# Patient Record
Sex: Female | Born: 1950 | Race: White | Hispanic: No | Smoking: Never smoker
Health system: Southern US, Community
[De-identification: ages and names within clinical notes are randomized; demographics above are authoritative.]

## PROBLEM LIST (undated history)

## (undated) DIAGNOSIS — I10 Essential (primary) hypertension: Secondary | ICD-10-CM

## (undated) HISTORY — PX: VASCULAR SURGERY: SHX849

---

## 2017-04-07 ENCOUNTER — Ambulatory Visit (HOSPITAL_COMMUNITY)
Admission: EM | Admit: 2017-04-07 | Discharge: 2017-04-07 | Disposition: A | Discharge status: Home / Self Care | Payer: Medicare Other | Attending: Family Medicine | Admitting: Family Medicine

## 2017-04-07 ENCOUNTER — Encounter (HOSPITAL_COMMUNITY): Payer: Self-pay

## 2017-04-07 ENCOUNTER — Other Ambulatory Visit: Payer: Self-pay

## 2017-04-07 ENCOUNTER — Encounter (HOSPITAL_COMMUNITY): Payer: Self-pay | Admitting: Emergency Medicine

## 2017-04-07 DIAGNOSIS — J019 Acute sinusitis, unspecified: Secondary | ICD-10-CM | POA: Diagnosis not present

## 2017-04-07 HISTORY — DX: Essential (primary) hypertension: I10

## 2017-04-07 MED ORDER — AMOXICILLIN-POT CLAVULANATE 875-125 MG PO TABS: 1.0000 | ORAL_TABLET | Freq: Two times a day (BID) | ORAL | 0 refills | Status: AC

## 2017-04-07 NOTE — ED Triage Notes (Signed)
PT reports URI symptoms 2 weeks ago that have resolved. PT now has long term sinus pain and pressure.

## 2017-04-07 NOTE — ED Provider Notes (Signed)
[facility]    CSN: [id number] Arrival date & time: 04/07/17  1756     History   Chief Complaint Chief Complaint  Patient presents with  . Nasal Congestion    HPI Molly Turner is a 67 y.o. female story of hypertension, Patient is presenting with URI symptoms- congestion, cough, sore throat.  Cough and sore throat are occasional, not persistent.  Denies ear pain. patient's main complaints are congestion. Symptoms have been going on for 2 weeks. Patient has tried Mucinex, TheraFlu, Mucus Relief PE, with minimal relief. Denies fever, nausea, vomiting, diarrhea.  Had one episode of vomiting resolved.  Denies shortness of breath and chest pain.  Marland Kitchen.   HPI  Past Medical History:  Diagnosis Date  . Hypertension     There are no active problems to display for this patient.   Past Surgical History:  Procedure Laterality Date  . VASCULAR SURGERY      OB History    No data available       Home Medications    Prior to Admission medications   Medication Sig Start Date End Date Taking? Authorizing Provider  esomeprazole (NEXIUM) 10 MG packet Take 10 mg by mouth daily before breakfast.   Yes [provider]  potassium chloride SA (K-DUR,KLOR-CON) 20 MEQ tablet Take 20 mEq by mouth once.   Yes [provider]  traZODone (DESYREL) 50 MG tablet Take 50 mg by mouth at bedtime.   Yes [provider]  triamterene (DYRENIUM) 50 MG capsule Take 25 mg by mouth daily.   Yes [provider]  amoxicillin-clavulanate (AUGMENTIN) 875-125 MG tablet Take 1 tablet by mouth 2 (two) times daily for 10 days. 04/07/17 04/17/17  Demarie Hyneman, Junius CreamerHallie C, PA-C    Family History History reviewed. No pertinent family history.  Social History Social History   Tobacco Use  . Smoking status: Never Smoker  . Smokeless tobacco: Never Used  Substance Use Topics  . Alcohol use: Yes  . Drug use: No     Allergies   Codeine   Review of Systems Review of  Systems  Constitutional: Negative for chills, fatigue and fever.  HENT: Positive for congestion, rhinorrhea, sinus pressure and sore throat. Negative for ear pain and trouble swallowing.   Respiratory: Positive for cough. Negative for chest tightness and shortness of breath.   Cardiovascular: Negative for chest pain.  Gastrointestinal: Negative for abdominal pain, nausea and vomiting.  Musculoskeletal: Negative for myalgias.  Skin: Negative for rash.  Neurological: Negative for dizziness, light-headedness and headaches.     Physical Exam Triage Vital Signs ED Triage Vitals [04/07/17 1929]  Enc Vitals Group     BP 140/85     Pulse Rate 79     Resp 16     Temp 98 F (36.7 C)     Temp Source Oral     SpO2 98 %     Weight      Height      Head Circumference      Peak Flow      Pain Score      Pain Loc      Pain Edu?      Excl. in GC?    No data found.  Updated Vital Signs BP 140/85 (BP Location: Left Arm)   Pulse 79   Temp 98 F (36.7 C) (Oral)   Resp 16   SpO2 98%    Physical Exam  Constitutional: She appears well-developed and well-nourished. No distress.  HENT:  Head: Normocephalic and atraumatic.  Right Ear: Tympanic membrane and ear canal normal.  Left Ear: Tympanic membrane and ear canal normal.  Nose: Rhinorrhea present. Right sinus exhibits no maxillary sinus tenderness and no frontal sinus tenderness. Left sinus exhibits no maxillary sinus tenderness and no frontal sinus tenderness.  Mouth/Throat: Uvula is midline and mucous membranes are normal. No oral lesions. No trismus in the jaw. No uvula swelling. Posterior oropharyngeal erythema present. Tonsils are 1+ on the right. Tonsils are 1+ on the left. No tonsillar exudate.  Non-erythematous TMs  Erythematous turbinates, non swollen   Eyes: Conjunctivae are normal.  Neck: Neck supple.  Cardiovascular: Normal rate and regular rhythm.  No murmur heard. Pulmonary/Chest: Effort normal and breath sounds  normal. No respiratory distress.  Abdominal: Soft. There is no tenderness.  Musculoskeletal: She exhibits no edema.  Neurological: She is alert.  Skin: Skin is warm and dry.  Psychiatric: She has a normal mood and affect.  Nursing note and vitals reviewed.    UC Treatments / Results  Labs (all labs ordered are listed, but only abnormal results are displayed) Labs Reviewed - No data to display  EKG  EKG Interpretation None       Radiology No results found.  Procedures Procedures (including critical care time)  Medications Ordered in UC Medications - No data to display   Initial Impression / Assessment and Plan / UC Course  I have reviewed the triage vital signs and the nursing notes.  Pertinent labs & imaging results that were available during my care of the patient were reviewed by me and considered in my medical decision making (see chart for details).      Patient presents with symptoms likely from sinusitis versus viral upper respiratory infection.  Do not suspect underlying cardiopulmonary process. Symptoms seem unlikely related to ACS, CHF or COPD exacerbations, pneumonia, pneumothorax. Patient is nontoxic appearing and not in need of emergent medical intervention.  Given symptoms have been going on for 2 weeks, we will treat for sinusitis with Augmentin.  Patient has been using Mucinex with minimal relief advised to try Flonase instead.  Also start daily allergy pill. Recommended symptom control with over the counter medications: Daily oral anti-histamine, Oral decongestant or IN corticosteroid, saline irrigations, cepacol lozenges, Robitussin, Delsym, honey tea.  Return if symptoms fail to improve in 1-2 weeks or you develop shortness of breath, chest pain, severe headache. Patient states understanding and is agreeable.      Final Clinical Impressions(s) / UC Diagnoses   Final diagnoses:  Acute sinusitis with symptoms > 10 days    ED Discharge Orders          Ordered    amoxicillin-clavulanate (AUGMENTIN) 875-125 MG tablet  2 times daily     04/07/17 1949       Controlled Substance Prescriptions [place] Controlled Substance Registry consulted? Not Applicable   Lew Dawes, [place] 04/07/17 2054

## 2017-04-07 NOTE — Discharge Instructions (Signed)
We are treating you for a sinus infection with Augmentin.  Please take twice daily for 10 days.  For better control congestion you may try Flonase instead of Mucinex.  You may get this over-the-counter, nasal spray to use in both nostrils once daily.  Please take a daily allergy pill like Zyrtec or Claritin, start brand is okay also.  This will take 5-7 days for you to notice an effect.  Continue to try to stay hydrated.  Please return if symptoms not improving with treatment.  Please return sooner if symptoms worsening or develop shortness of breath, difficulty breathing.

## 2017-04-07 NOTE — ED Notes (Signed)
Pt sts she has to leave.... sts [place] an apt [place]

## 2017-04-07 NOTE — ED Triage Notes (Signed)
[facility] and [facility], pt has taken OTC Medications but has no relief

## 2020-11-27 ENCOUNTER — Other Ambulatory Visit: Payer: Self-pay

## 2020-11-27 ENCOUNTER — Ambulatory Visit
Admission: RE | Admit: 2020-11-27 | Discharge: 2020-11-27 | Disposition: A | Discharge status: Home / Self Care | Payer: PRIVATE HEALTH INSURANCE | Source: Ambulatory Visit | Attending: Family Medicine | Admitting: Family Medicine

## 2020-11-27 ENCOUNTER — Other Ambulatory Visit: Payer: Self-pay | Admitting: Family Medicine

## 2020-11-27 DIAGNOSIS — R053 Chronic cough: Secondary | ICD-10-CM

## 2021-04-12 ENCOUNTER — Other Ambulatory Visit: Payer: Self-pay | Admitting: Family Medicine

## 2021-04-12 ENCOUNTER — Ambulatory Visit
Admission: RE | Admit: 2021-04-12 | Discharge: 2021-04-12 | Disposition: A | Discharge status: Home / Self Care | Payer: Medicare Other | Source: Ambulatory Visit | Attending: Family Medicine | Admitting: Family Medicine

## 2021-04-12 DIAGNOSIS — R053 Chronic cough: Secondary | ICD-10-CM

## 2021-10-30 ENCOUNTER — Other Ambulatory Visit: Payer: Self-pay | Admitting: Family Medicine

## 2021-10-30 DIAGNOSIS — E2839 Other primary ovarian failure: Secondary | ICD-10-CM

## 2022-03-13 LAB — COLOGUARD: COLOGUARD: POSITIVE — AB

## 2022-03-13 LAB — EXTERNAL GENERIC LAB PROCEDURE: COLOGUARD: POSITIVE — AB

## 2022-09-27 IMAGING — CR DG CHEST 2V
2 series · 2 of 2 positions shown · non-contrast
Comparison: 11/27/2020 chest radiograph

CLINICAL DATA: Cough and shortness of breath for 1 month.

EXAM:
CHEST - 2 VIEW

[w chest pa]
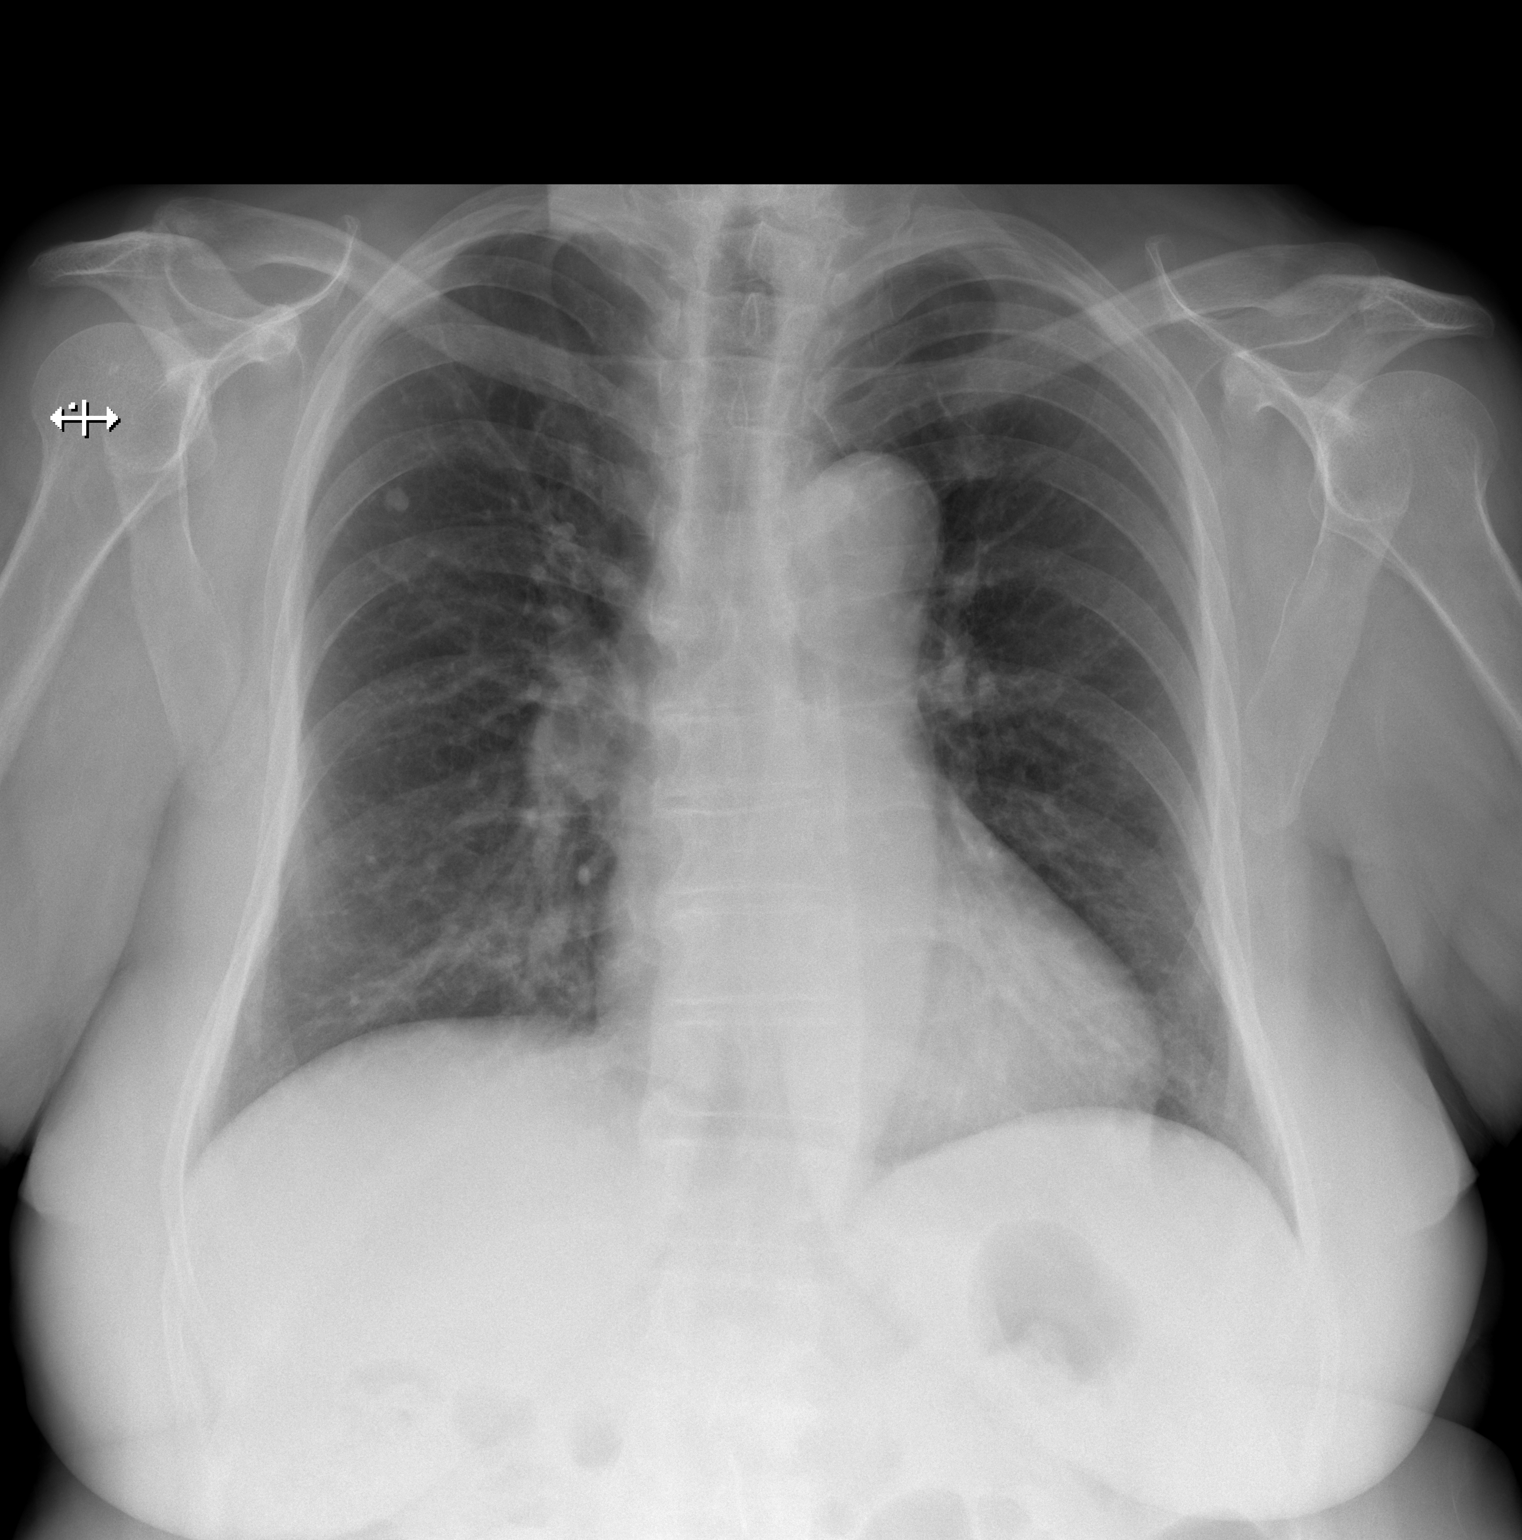

[w chest lat]
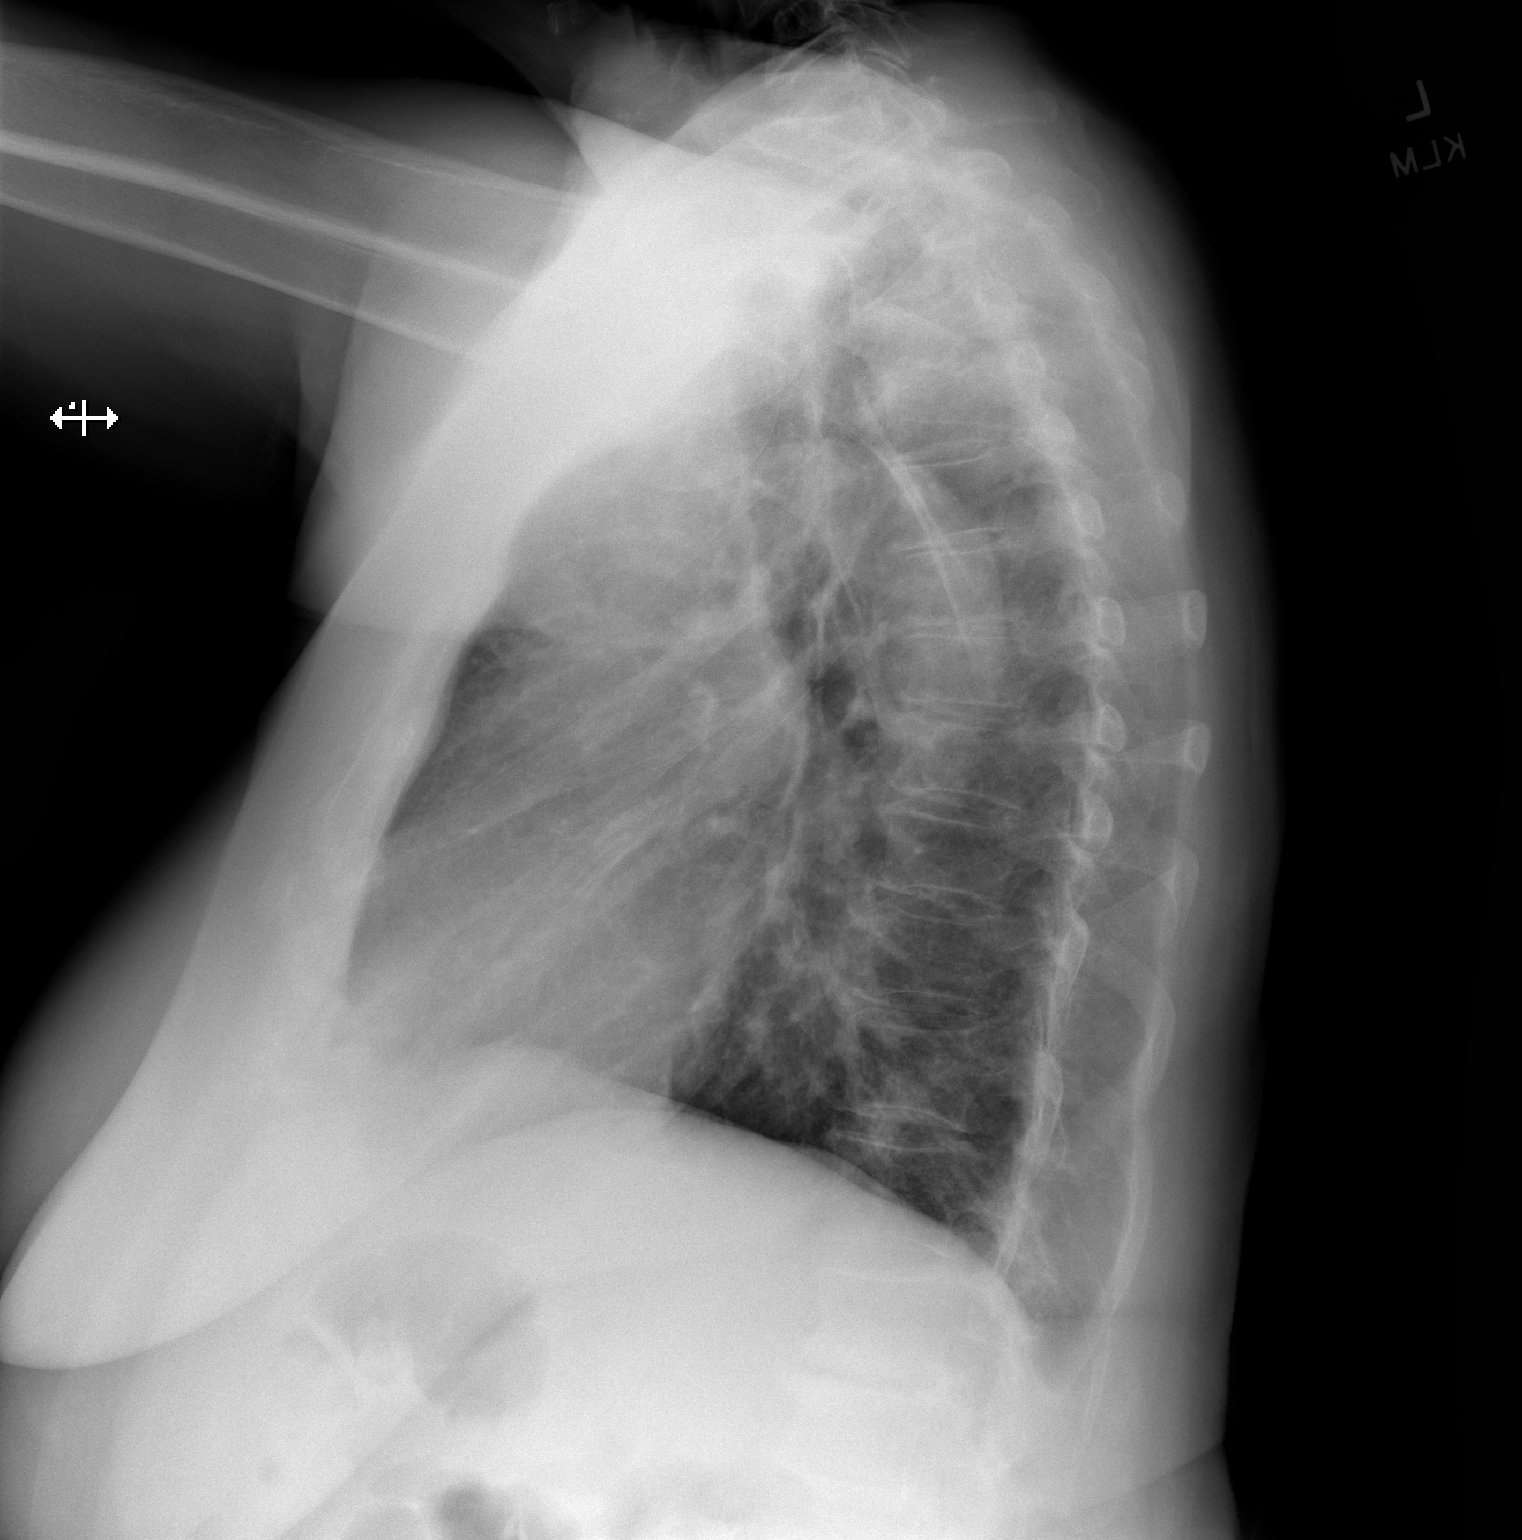

[2 of 2 positions shown; findings below may reference images not displayed]

FINDINGS: The cardiomediastinal silhouette is unremarkable.

Mild peribronchial thickening and RIGHT UPPER lung calcified
granuloma again noted.

There is no evidence of focal airspace disease, pulmonary edema,
suspicious pulmonary nodule/mass, pleural effusion, or pneumothorax.

No acute bony abnormalities are identified.
IMPRESSION: No active cardiopulmonary disease.

## 2023-12-17 ENCOUNTER — Other Ambulatory Visit: Payer: Self-pay | Admitting: Family Medicine

## 2023-12-17 DIAGNOSIS — Z1231 Encounter for screening mammogram for malignant neoplasm of breast: Secondary | ICD-10-CM

## 2023-12-20 ENCOUNTER — Encounter (HOSPITAL_BASED_OUTPATIENT_CLINIC_OR_DEPARTMENT_OTHER): Payer: Self-pay | Admitting: Internal Medicine

## 2023-12-20 ENCOUNTER — Other Ambulatory Visit (HOSPITAL_BASED_OUTPATIENT_CLINIC_OR_DEPARTMENT_OTHER): Payer: Self-pay | Admitting: Family Medicine

## 2023-12-20 DIAGNOSIS — E2839 Other primary ovarian failure: Secondary | ICD-10-CM

## 2023-12-30 ENCOUNTER — Encounter (INDEPENDENT_AMBULATORY_CARE_PROVIDER_SITE_OTHER): Payer: Self-pay | Admitting: Physician Assistant

## 2023-12-30 ENCOUNTER — Ambulatory Visit (INDEPENDENT_AMBULATORY_CARE_PROVIDER_SITE_OTHER): Admitting: Physician Assistant

## 2023-12-30 VITALS — BP 122/82 | HR 73 | Ht 67.0 in | Wt 181.0 lb

## 2023-12-30 DIAGNOSIS — Z0110 Encounter for examination of ears and hearing without abnormal findings: Secondary | ICD-10-CM | POA: Diagnosis not present

## 2023-12-30 DIAGNOSIS — H9193 Unspecified hearing loss, bilateral: Secondary | ICD-10-CM

## 2023-12-30 NOTE — Progress Notes (Signed)
 Dear Dr. Burney, Here is my assessment for our mutual patient, Molly Turner. Thank you for allowing me the opportunity to care for your patient. Please do not hesitate to contact me should you have any other questions. Sincerely, Chyrl Cohen PA-C  Otolaryngology Clinic Note Referring provider: Dr. Burney HPI:  Molly Turner is a 73 y.o. female kindly referred by Dr. Burney   The patient is a 73 year old female seen in our office for hearing evaluation.  The patient notes that she went saw her primary care provider and they recommended having baseline audiological evaluation completed.  The patient denies any history of significant hearing loss, she denies any pain in ears, noted ringing in her ears no history of significant noise exposure.  She does note a sister that is having rather progressive hearing loss in her later years.  She does note she went to [employer] approximately 10 years ago and was told that her hearing on the left was worse than the right.   Independent Review of Additional Tests or Records:  None   PMH/Meds/All/SocHx/FamHx/ROS:   Past Medical History:  Diagnosis Date   Hypertension      Past Surgical History:  Procedure Laterality Date   VASCULAR SURGERY      No family history on file.   Social Connections: Not on file      Current Outpatient Medications:    cyclobenzaprine (FLEXERIL) 10 MG tablet, Take 5-10 mg by mouth at bedtime as needed., Disp: , Rfl:    esomeprazole (NEXIUM) 10 MG packet, Take 10 mg by mouth daily before breakfast., Disp: , Rfl:    potassium chloride SA (K-DUR,KLOR-CON) 20 MEQ tablet, Take 20 mEq by mouth once., Disp: , Rfl:    traZODone (DESYREL) 50 MG tablet, Take 50 mg by mouth at bedtime. (Patient taking differently: Take 50 mg by mouth at bedtime. Winging off of this medication.), Disp: , Rfl:    triamterene (DYRENIUM) 50 MG capsule, Take 25 mg by mouth daily., Disp: , Rfl:    Physical Exam:   BP 122/82 (BP Location: Right  Arm, Patient Position: Sitting)   Pulse 73   Ht 5' 7 (1.702 m)   Wt 181 lb (82.1 kg)   SpO2 95%   BMI 28.35 kg/m   Pertinent Findings  CN II-XII intact Bilateral EAC clear and TM intact with well pneumatized middle ear spaces Anterior rhinoscopy: Septum left deviation; bilateral inferior turbinates with hypertrophy No lesions of oral cavity/oropharynx; dentition is in normal limits No obviously palpable neck masses/lymphadenopathy/thyromegaly No respiratory distress or stridor     Seprately Identifiable Procedures:  None  Impression & Plans:  Molly Turner is a 73 y.o. female with the following   Evaluation for hearing-  Patient overall looks well today, would recommend audiological evaluation.  Once these results are available and happy to discuss them with the patient if there are any significant abnormalities.   - f/u phone call discussion with audiological results   Thank you for allowing me the opportunity to care for your patient. Please do not hesitate to contact me should you have any other questions.  Sincerely, Chyrl Cohen PA-C [facility] Phone: [phone] Fax: [phone]  12/30/2023, 11:13 AM

## 2024-01-08 ENCOUNTER — Encounter: Payer: Self-pay | Admitting: Diagnostic Neuroimaging

## 2024-01-08 ENCOUNTER — Ambulatory Visit: Admitting: Diagnostic Neuroimaging

## 2024-01-08 VITALS — BP 132/75 | HR 87 | Ht 67.0 in | Wt 189.0 lb

## 2024-01-08 DIAGNOSIS — G250 Essential tremor: Secondary | ICD-10-CM

## 2024-01-08 NOTE — Patient Instructions (Addendum)
  Essential tremor (head and voice tremor; since ~2022) - consider propranolol or primidone

## 2024-01-08 NOTE — Progress Notes (Signed)
 [facility]  PATIENT: Molly Turner DOB: Jul 30, 1950  REFERRING CLINICIAN: Burney Darice CROME, MD HISTORY FROM: patient  REASON FOR VISIT: new consult   HISTORICAL  CHIEF COMPLAINT:  Chief Complaint  Patient presents with   Tremors    RM 6 alone Pt is well, reports in the last 3-4 months she has had a worsening in head tremor. She does mention family history of parkinsons.     HISTORY OF PRESENT ILLNESS:   73 year old female here for evaluation of tremor.  Symptoms started 3 to 4 years ago.  This was mild but has progressed over time.  Mainly notes head tremor shaking side-to-side and irregularly, notes some muscle spasm in the left posterior neck.  Occasionally feels a sensation of hoarse voice.  No tremor in arms or legs.  No family history of tremor.  No gait or balance difficulties.  No speech or swallowing difficulties.  Otherwise patient is very healthy.   REVIEW OF SYSTEMS: Full 14 system review of systems performed and negative with exception of: As per HPI.  ALLERGIES: Allergies  Allergen Reactions   Codeine Other (See Comments)    Abdominal pain    HOME MEDICATIONS: Outpatient Medications Prior to Visit  Medication Sig Dispense Refill   cyclobenzaprine (FLEXERIL) 10 MG tablet Take 5-10 mg by mouth at bedtime as needed.     esomeprazole (NEXIUM) 10 MG packet Take 10 mg by mouth daily before breakfast.     potassium chloride SA (K-DUR,KLOR-CON) 20 MEQ tablet Take 20 mEq by mouth once.     traZODone (DESYREL) 50 MG tablet Take 50 mg by mouth at bedtime. (Patient taking differently: Take 50 mg by mouth at bedtime. Winging off of this medication.)     triamterene (DYRENIUM) 50 MG capsule Take 25 mg by mouth daily.     No facility-administered medications prior to visit.    PAST MEDICAL HISTORY: Past Medical History:  Diagnosis Date   Hypertension     PAST SURGICAL HISTORY: Past Surgical History:  Procedure Laterality Date   VASCULAR  SURGERY      FAMILY HISTORY: Family History  Problem Relation Age of Onset   Parkinson's disease Maternal Grandfather     SOCIAL HISTORY: Social History   Socioeconomic History   Marital status: Widowed    Spouse name: Not on file   Number of children: Not on file   Years of education: Not on file   Highest education level: Not on file  Occupational History   Not on file  Tobacco Use   Smoking status: Never   Smokeless tobacco: Never  Substance and Sexual Activity   Alcohol use: Yes   Drug use: No   Sexual activity: Not on file  Other Topics Concern   Not on file  Social History Narrative   Not on file   Social Drivers of Health   Financial Resource Strain: Not on file  Food Insecurity: Not on file  Transportation Needs: Not on file  Physical Activity: Not on file  Stress: Not on file  Social Connections: Not on file  Intimate Partner Violence: Not on file     PHYSICAL EXAM  GENERAL EXAM/CONSTITUTIONAL: Vitals:  Vitals:   01/08/24 1446  BP: 132/75  Pulse: 87  Weight: 189 lb (85.7 kg)  Height: 5' 7 (1.702 m)   Body mass index is 29.6 kg/m. Wt Readings from Last 3 Encounters:  01/08/24 189 lb (85.7 kg)  12/30/23 181 lb (82.1 kg)  04/07/17 210 lb (  95.3 kg)   Patient is in no distress; well developed, nourished and groomed; neck is supple; EXCEPT INCREASED TIGHTNESS IN THE LEFT POSTERIOR NECK WITH NECK ROTATION TO LEFT   CARDIOVASCULAR: Examination of carotid arteries is normal; no carotid bruits Regular rate and rhythm, no murmurs Examination of peripheral vascular system by observation and palpation is normal  EYES: Ophthalmoscopic exam of optic discs and posterior segments is normal; no papilledema or hemorrhages No results found.  MUSCULOSKELETAL: Gait, strength, tone, movements noted in Neurologic exam below  NEUROLOGIC: MENTAL STATUS:      No data to display         awake, alert, oriented to person, place and time recent and  remote memory intact normal attention and concentration language fluent, comprehension intact, naming intact fund of knowledge appropriate  CRANIAL NERVE:  2nd - no papilledema on fundoscopic exam 2nd, 3rd, 4th, 6th - pupils equal and reactive to light, visual fields full to confrontation, extraocular muscles intact, no nystagmus 5th - facial sensation symmetric 7th - facial strength symmetric 8th - hearing intact 9th - palate elevates symmetrically, uvula midline 11th - shoulder shrug symmetric 12th - tongue protrusion midline INTERMITTENT POSTURAL HEAD TREMOR  MOTOR:  normal bulk and tone, full strength in the BUE, BLE FINE TREMOR IN BUE  SENSORY:  normal and symmetric to light touch, temperature, vibration  COORDINATION:  finger-nose-finger, fine finger movements normal  REFLEXES:  deep tendon reflexes present and symmetric  GAIT/STATION:  narrow based gait; able to walk on toes, heels and tandem; romberg is negative     DIAGNOSTIC DATA (LABS, IMAGING, TESTING) - I reviewed patient records, labs, notes, testing and imaging myself where available.  No results found for: WBC, HGB, HCT, MCV, PLT No results found for: NA, K, CL, CO2, GLUCOSE, BUN, CREATININE, CALCIUM, PROT, ALBUMIN, AST, ALT, ALKPHOS, BILITOT, GFRNONAA, GFRAA No results found for: CHOL, HDL, LDLCALC, LDLDIRECT, TRIG, CHOLHDL No results found for: YHAJ8R No results found for: VITAMINB12 No results found for: TSH    ASSESSMENT AND PLAN  73 y.o. year old female here with mild postural head and voice tremor since approximately 2022, progressive over time.  Signs symptoms most consistent with benign essential tremor.  Dx:  1. Essential tremor    PLAN:  Essential tremor (head and voice tremor; since ~2022) - consider propranolol or primidone if symptoms worsen; will hold off for now - monitor for cervical dystonia (left sided neck spasms;  this can mimic essential tremor)  Return for return to PCP, pending if symptoms worsen or fail to improve.    EDUARD FABIENE HANLON, MD 01/08/2024, 3:13 PM Certified in Neurology, Neurophysiology and Neuroimaging  [facility] [address] [place], [place] [address] [phone]

## 2024-02-12 ENCOUNTER — Other Ambulatory Visit (INDEPENDENT_AMBULATORY_CARE_PROVIDER_SITE_OTHER): Payer: Self-pay | Admitting: Physician Assistant

## 2024-02-12 ENCOUNTER — Ambulatory Visit (INDEPENDENT_AMBULATORY_CARE_PROVIDER_SITE_OTHER): Admitting: Audiology

## 2024-02-12 DIAGNOSIS — H9193 Unspecified hearing loss, bilateral: Secondary | ICD-10-CM

## 2024-02-19 ENCOUNTER — Ambulatory Visit (INDEPENDENT_AMBULATORY_CARE_PROVIDER_SITE_OTHER): Admitting: Audiology

## 2024-02-19 DIAGNOSIS — H903 Sensorineural hearing loss, bilateral: Secondary | ICD-10-CM | POA: Diagnosis not present

## 2024-02-19 NOTE — Progress Notes (Signed)
°  [address] [place], [place] [address] [phone]  Audiological Evaluation    Name: Molly Turner     DOB:   01-Apr-1950      MRN:   [id number]                                                                                     Service Date: 02/19/2024     Accompanied by: self     Patient comes today after Reyes Cohen, PA-C sent a referral for a hearing evaluation to determine hearing status per her primary care physician.   Symptoms Yes Details  Hearing loss  [x]  Had a test in [employer] and was told to perhaps to have a little hearing loss in one ear.  Tinnitus  []    Ear pain/ infections/pressure  []    Balance problems  []    Noise exposure history  []    Previous ear surgeries  []    Family history of hearing loss  []    Amplification  []    Other  [x]  Member has some tremor and has been evaluated by a neurologist    Otoscopy: Right ear: Clear external ear canal and notable landmarks visualized on the tympanic membrane. Left ear:  Clear external ear canal and notable landmarks visualized on the tympanic membrane.  Tympanometry: Right ear: Type A - Normal external ear canal volume with normal middle ear pressure and normal tympanic membrane compliance. Findings are consistent with normal middle ear function. Left ear: Type A - Normal external ear canal volume with normal middle ear pressure and normal tympanic membrane compliance. Findings are consistent with normal middle ear function.  Hearing Evaluation The hearing test results were completed under headphones and results are deemed to be of good reliability. Test technique:  conventional    Pure tone Audiometry: Right ear- normal hearing 250 Hz - 2000 Hz and then mild  sensorineural hearing loss from 1500 Hz - 8000 Hz. Left ear-  normal hearing 250 Hz - 2000 Hz and then mild to moderate sensorineural hearing loss from 1500 Hz - 8000 Hz.  Speech Audiometry: Right ear- Speech Reception Threshold (SRT) was  obtained at 15 dBHL. Left ear-Speech Reception Threshold (SRT) was obtained at 15 dBHL.   Word Recognition Score Tested using NU-6 (recorded) Right ear: 100% was obtained at a presentation level of 65 dBHL with contralateral masking which is deemed as  excellent. Left ear: 100% was obtained at a presentation level of 65 dBHL with contralateral masking which is deemed as  excellent.   Impression: There is not a significant difference in pure-tone thresholds between ears., There is not a significant difference in the word recognition score in between ears.    Recommendations: Follow up with Primary Care Provider as scheduled. Return for a hearing evaluation if concerns with hearing changes arise or per MD recommendation. Consider a communication needs assessment for amplification after medical clearance is obtained, if needed.   Jesalyn Finazzo MARIE LEROUX-MARTINEZ, AUD

## 2024-03-02 ENCOUNTER — Telehealth (INDEPENDENT_AMBULATORY_CARE_PROVIDER_SITE_OTHER): Payer: Self-pay

## 2024-03-02 NOTE — Telephone Encounter (Signed)
 Patient called back regarding hearing test results. Explained results to patient. Patient says that she does not feel like she needs hearing aids or a follow up appointment. (Just letting you know).

## 2024-03-02 NOTE — Telephone Encounter (Signed)
 Called patient regarding hearing test results. Left voicemail for patient to give us  a call back for results.
